# Patient Record
Sex: Male | Born: 1980 | Race: White | Hispanic: No | Marital: Single
Health system: Midwestern US, Community
[De-identification: ages and names within clinical notes are randomized; demographics above are authoritative.]

---

## 2000-06-16 ENCOUNTER — Encounter: Payer: Self-pay | Admitting: Emergency Medicine

## 2000-06-16 ENCOUNTER — Emergency Department (HOSPITAL_COMMUNITY): Admission: EM | Admit: 2000-06-16 | Discharge: 2000-06-16 | Payer: Self-pay | Admitting: Emergency Medicine

## 2004-10-12 ENCOUNTER — Emergency Department: Payer: Self-pay | Admitting: Emergency Medicine

## 2005-01-20 ENCOUNTER — Emergency Department: Payer: Self-pay | Admitting: Emergency Medicine

## 2005-07-21 ENCOUNTER — Emergency Department: Payer: Self-pay | Admitting: Emergency Medicine

## 2007-01-30 ENCOUNTER — Emergency Department: Payer: Self-pay | Admitting: Emergency Medicine

## 2007-07-13 ENCOUNTER — Emergency Department: Payer: Self-pay | Admitting: Emergency Medicine

## 2007-08-21 ENCOUNTER — Emergency Department: Payer: Self-pay | Admitting: Emergency Medicine

## 2009-04-30 ENCOUNTER — Emergency Department: Payer: Self-pay | Admitting: Emergency Medicine

## 2010-03-03 ENCOUNTER — Emergency Department: Payer: Self-pay | Admitting: Emergency Medicine

## 2011-06-26 ENCOUNTER — Emergency Department: Payer: Self-pay | Admitting: *Deleted

## 2014-05-11 ENCOUNTER — Encounter: Admit: 2014-05-11

## 2014-05-11 ENCOUNTER — Inpatient Hospital Stay
Admit: 2014-05-12 | Discharge: 2014-05-12 | Disposition: A | Discharge status: Home / Self Care | Payer: PRIVATE HEALTH INSURANCE | Source: Home / Self Care | Admitting: Emergency Medicine

## 2014-05-11 DIAGNOSIS — T1490XA Injury, unspecified, initial encounter: Secondary | ICD-10-CM

## 2014-05-11 LAB — COMPREHENSIVE METABOLIC PANEL
ALT: 24 U/L (ref 0–40)
AST: 17 U/L (ref 0–39)
Albumin: 4.4 g/dL (ref 3.5–5.2)
Alkaline Phosphatase: 78 U/L (ref 40–129)
Anion Gap: 13 mmol/L (ref 7–16)
BUN: 12 mg/dL (ref 6–20)
CO2: 26 mmol/L (ref 22–29)
Calcium: 9 mg/dL (ref 8.6–10.2)
Chloride: 102 mmol/L (ref 98–107)
Creatinine: 0.8 mg/dL (ref 0.7–1.2)
GFR African American: >60
GFR Non-African American: >60 mL/min/{1.73_m2} (ref >=60)
Glucose: 96 mg/dL (ref 74–109)
Potassium: 4 mmol/L (ref 3.5–5.0)
Sodium: 141 mmol/L (ref 132–146)
Total Bilirubin: 0.3 mg/dL (ref 0.0–1.2)
Total Protein: 6.8 g/dL (ref 6.4–8.3)

## 2014-05-11 LAB — SERUM DRUG SCREEN
Acetaminophen Level: <15 ug/mL (ref 10.0–30.0)
Ethanol Lvl: <10 mg/dL
Salicylate, Serum: <0.3 mg/dL (ref 0.0–30.0)
TCA Scrn: NEGATIVE ng/mL

## 2014-05-11 LAB — BLOOD GAS, ARTERIAL
B.E.: 2.6 mmol/L (ref <=3.0)
COHb: 6.2 % — ABNORMAL HIGH (ref 0.0–1.5)
Date Analyzed: 20160421
Date Of Collection: 20160421
HCO3: 24.9 mmol/L (ref 22.0–26.0)
HHb: 0.6 % (ref 0.0–5.0)
Lab: 9558
MetHb: 0.3 % (ref 0.0–1.5)
O2 Content: 22.6 mL/dL
O2 Sat: 99.4 % — ABNORMAL HIGH (ref 92.0–98.5)
O2Hb: 92.9 % — ABNORMAL LOW (ref 94.0–97.0)
Operator ID: 145800
PCO2: 32.5 mmHg — ABNORMAL LOW (ref 35.0–45.0)
PO2: 331 mmHg — ABNORMAL HIGH (ref 60.0–100.0)
Potassium: 3.84 mmol/L (ref 3.30–5.10)
Pt Temp: 37 C
Time Analyzed: 1810
Time Collected: 1807
pH, Blood Gas: 7.503 — ABNORMAL HIGH (ref 7.350–7.450)
tHb (est): 16.7 g/dL — ABNORMAL HIGH (ref 11.5–16.5)

## 2014-05-11 LAB — CBC
Hematocrit: 48 % (ref 37.0–54.0)
Hemoglobin: 16.2 g/dL (ref 12.5–16.5)
MCH: 31.1 pg (ref 26.0–35.0)
MCHC: 33.8 % (ref 32.0–34.5)
MCV: 92.3 fL (ref 80.0–99.9)
MPV: 8.4 fL (ref 7.0–12.0)
Platelets: 284 E9/L (ref 130–450)
RBC: 5.2 E12/L (ref 3.80–5.80)
RDW: 13.3 fL (ref 11.5–15.0)
WBC: 9.6 E9/L (ref 4.5–11.5)

## 2014-05-11 LAB — APTT: aPTT: 27.9 s (ref 24.5–35.1)

## 2014-05-11 LAB — TYPE AND SCREEN
ABO/Rh: B POS
Antibody Screen: NEGATIVE

## 2014-05-11 LAB — PROTIME-INR
INR: 1
Protime: 11.3 s (ref 9.3–12.4)

## 2014-05-11 LAB — LACTIC ACID: Lactic Acid: 2.6 mmol/L — ABNORMAL HIGH (ref 0.5–2.2)

## 2014-05-11 MED ORDER — MORPHINE SULFATE (PF) 2 MG/ML IV SOLN
2 MG/ML | INTRAVENOUS | Status: DC | PRN
Start: 2014-05-11 — End: 2014-05-11

## 2014-05-11 MED ORDER — SODIUM CHLORIDE 0.9 % IV SOLN
0.9 % | INTRAVENOUS | Status: DC
Start: 2014-05-11 — End: 2014-05-12
  Administered 2014-05-11: via INTRAVENOUS

## 2014-05-11 MED ORDER — KETOROLAC TROMETHAMINE 30 MG/ML IJ SOLN
30 MG/ML | Freq: Four times a day (QID) | INTRAMUSCULAR | Status: DC | PRN
Start: 2014-05-11 — End: 2014-05-12
  Administered 2014-05-11: 30 mg via INTRAVENOUS

## 2014-05-11 MED ORDER — ONDANSETRON HCL 4 MG/2ML IJ SOLN
4 MG/2ML | Freq: Four times a day (QID) | INTRAMUSCULAR | Status: DC | PRN
Start: 2014-05-11 — End: 2014-05-12

## 2014-05-11 MED FILL — KETOROLAC TROMETHAMINE 30 MG/ML IJ SOLN: 30 MG/ML | INTRAMUSCULAR | Qty: 1
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
## 2014-05-11 NOTE — ED Notes (Signed)
Off- going Nurse: Sheliah Hatchawnya, RN  Oncoming Nurse: Maryelizabeth RowanWyllow, RN  Reviewed results and discussed plan of care using an SBAR report at bedside.  The patient/family did want to be included.     Yvetta Coderawnya Kyrstin Campillo, RN  05/11/14 [phone]

## 2014-05-11 NOTE — ED Notes (Signed)
Patient alert and oriented x4. Speech clear. Respirations easy/unlabored. Skin warm/dry. No signs of acute distress noted. Pt stable for transport.      Jamas LavWyllow D Seydina Holliman, RN  05/11/14 [phone]
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
## 2014-05-11 NOTE — ED Notes (Signed)
Strong femoral pulses bilaterally, bilateral breath sounds bilaterally. 4mm pupils reactive bilaterally.     Yvetta Coderawnya Nussen Pullin, RN  05/11/14 [phone]

## 2014-05-11 NOTE — ED Notes (Signed)
[facility]    Yvetta Coderawnya Sueko Dimichele, RN  05/11/14 1816
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
## 2014-05-11 NOTE — ED Notes (Signed)
xrays obtained at this time.     Yvetta Coderawnya Aprill Banko, RN  05/11/14 1807

## 2014-05-11 NOTE — ED Notes (Signed)
Pt belongings with pt to CT     Yvetta Coderawnya Gayanne Prescott, RN  05/11/14 1820

## 2014-05-12 LAB — LACTIC ACID: Lactic Acid: 0.9 mmol/L (ref 0.5–2.2)

## 2014-05-12 MED ORDER — NICOTINE 21 MG/24HR TD PT24
21 MG/24HR | Freq: Once | TRANSDERMAL | Status: DC
Start: 2014-05-12 — End: 2014-05-12
  Administered 2014-05-12: 04:00:00 1 via TRANSDERMAL

## 2014-05-12 MED ORDER — ACETAMINOPHEN 650 MG RE SUPP
650 MG | RECTAL | Status: DC | PRN
Start: 2014-05-12 — End: 2014-05-11

## 2014-05-12 MED ORDER — NORMAL SALINE FLUSH 0.9 % IV SOLN
0.9 % | INTRAVENOUS | Status: DC | PRN
Start: 2014-05-12 — End: 2014-05-12

## 2014-05-12 MED ORDER — NORMAL SALINE FLUSH 0.9 % IV SOLN
0.9 % | Freq: Two times a day (BID) | INTRAVENOUS | Status: DC
Start: 2014-05-12 — End: 2014-05-12

## 2014-05-12 MED ORDER — ACETAMINOPHEN 325 MG PO TABS
325 MG | ORAL | Status: AC
Start: 2014-05-12 — End: 2014-05-11
  Administered 2014-05-12: 03:00:00 650 via ORAL

## 2014-05-12 MED ORDER — ONDANSETRON HCL 4 MG/2ML IJ SOLN
4 MG/2ML | Freq: Four times a day (QID) | INTRAMUSCULAR | Status: DC | PRN
Start: 2014-05-12 — End: 2014-05-12

## 2014-05-12 MED ORDER — MAGNESIUM HYDROXIDE 400 MG/5ML PO SUSP
400 MG/5ML | Freq: Every day | ORAL | Status: DC | PRN
Start: 2014-05-12 — End: 2014-05-12

## 2014-05-12 MED ORDER — ACETAMINOPHEN 325 MG PO TABS
325 MG | ORAL | Status: DC | PRN
Start: 2014-05-12 — End: 2014-05-12

## 2014-05-12 MED ORDER — SODIUM CHLORIDE 0.9 % IV BOLUS
0.9 % | Freq: Once | INTRAVENOUS | Status: AC
Start: 2014-05-12 — End: 2014-05-11
  Administered 2014-05-12: 01:00:00 500 mL via INTRAVENOUS

## 2014-05-12 MED FILL — NICOTINE 21 MG/24HR TD PT24: 21 MG/24HR | TRANSDERMAL | Qty: 1

## 2014-05-12 MED FILL — TYLENOL 325 MG PO TABS: 325 MG | ORAL | Qty: 2

## 2014-05-12 NOTE — Plan of Care (Signed)
Problem: Pain:  Goal: Pain level will decrease  Pain level will decrease   Outcome: Met This Shift

## 2014-05-12 NOTE — Plan of Care (Signed)
Problem: Pain:  Goal: Pain level will decrease  Pain level will decrease   Outcome: Ongoing
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
## 2014-05-12 NOTE — Other (Addendum)
Patient Acct Nbr:  [id number]  Primary AUTH/CERT:    Primary Insurance Company Name:   ANTHEM/BLUE  Primary Insurance Plan Name:  [employer] research (life [employer]UE [employer]THEM OUT OF STATE PPO HMO  Primary Insurance Group Number:  [id number]  Primary Insurance Plan Type: B  Primary Insurance Policy Number:  [id number]

## 2014-05-12 NOTE — Progress Notes (Signed)
Database completed, patient states that he does not take any medications at home.
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
## 2019-12-13 ENCOUNTER — Emergency Department: Payer: 59

## 2019-12-13 ENCOUNTER — Encounter: Payer: Self-pay | Admitting: Emergency Medicine

## 2019-12-13 ENCOUNTER — Other Ambulatory Visit: Payer: Self-pay

## 2019-12-13 ENCOUNTER — Emergency Department
Admission: EM | Admit: 2019-12-13 | Discharge: 2019-12-13 | Disposition: A | Discharge status: Home / Self Care | Payer: 59 | Attending: Emergency Medicine | Admitting: Emergency Medicine

## 2019-12-13 DIAGNOSIS — R519 Headache, unspecified: Secondary | ICD-10-CM | POA: Diagnosis not present

## 2019-12-13 DIAGNOSIS — M545 Low back pain, unspecified: Secondary | ICD-10-CM | POA: Diagnosis not present

## 2019-12-13 DIAGNOSIS — R000 Tachycardia, unspecified: Secondary | ICD-10-CM | POA: Diagnosis not present

## 2019-12-13 DIAGNOSIS — F17210 Nicotine dependence, cigarettes, uncomplicated: Secondary | ICD-10-CM | POA: Diagnosis not present

## 2019-12-13 DIAGNOSIS — R0602 Shortness of breath: Secondary | ICD-10-CM | POA: Diagnosis not present

## 2019-12-13 DIAGNOSIS — R079 Chest pain, unspecified: Secondary | ICD-10-CM

## 2019-12-13 DIAGNOSIS — R0789 Other chest pain: Secondary | ICD-10-CM | POA: Insufficient documentation

## 2019-12-13 LAB — CBC
HCT: 46.5 % (ref 39.0–52.0)
Hemoglobin: 16.2 g/dL (ref 13.0–17.0)
MCH: 32.1 pg (ref 26.0–34.0)
MCHC: 34.8 g/dL (ref 30.0–36.0)
MCV: 92.3 fL (ref 80.0–100.0)
Platelets: 368 10*3/uL (ref 150–400)
RBC: 5.04 MIL/uL (ref 4.22–5.81)
RDW: 12 % (ref 11.5–15.5)
WBC: 10.4 10*3/uL (ref 4.0–10.5)
nRBC: 0 % (ref 0.0–0.2)

## 2019-12-13 LAB — BASIC METABOLIC PANEL
Anion gap: 13 (ref 5–15)
BUN: 13 mg/dL (ref 6–20)
CO2: 24 mmol/L (ref 22–32)
Calcium: 9.4 mg/dL (ref 8.9–10.3)
Chloride: 101 mmol/L (ref 98–111)
Creatinine, Ser: 0.82 mg/dL (ref 0.61–1.24)
GFR, Estimated: >60 mL/min (ref >60)
Glucose, Bld: 130 mg/dL — ABNORMAL HIGH (ref 70–99)
Potassium: 3.8 mmol/L (ref 3.5–5.1)
Sodium: 138 mmol/L (ref 135–145)

## 2019-12-13 LAB — FIBRIN DERIVATIVES D-DIMER (ARMC ONLY): Fibrin derivatives D-dimer (ARMC): 558.71 ng/mL (FEU) — ABNORMAL HIGH (ref 0.00–499.00)

## 2019-12-13 LAB — TROPONIN I (HIGH SENSITIVITY)
Troponin I (High Sensitivity): 4 ng/L (ref <18)
Troponin I (High Sensitivity): 4 ng/L (ref <18)

## 2019-12-13 MED ORDER — IOHEXOL 350 MG/ML SOLN
75.0000 mL | Freq: Once | INTRAVENOUS | Status: AC | PRN
  Administered 2019-12-13: 75 mL via INTRAVENOUS

## 2019-12-13 NOTE — ED Notes (Signed)
Pt in CT.

## 2019-12-13 NOTE — ED Triage Notes (Signed)
C/O intermittent CP x 2 days, worse today. Describes many stressors in life currently  C/O left sided CP and SOB

## 2019-12-13 NOTE — Discharge Instructions (Signed)
Your EKG and troponins to cardiac blood test your chest x-ray and CAT scan all look okay.  I am not sure what caused the rapid heart rate or the chest discomfort.  I do want you to follow-up with cardiology.  Dr. Juliann Pares is on-call.  He is very good.  If you see a call his office tomorrow morning they should be out of set you up with an appointment within the week.  Please return here if you have worsening symptoms of chest pain shortness of breath or anything else.
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
## 2020-10-09 ENCOUNTER — Other Ambulatory Visit: Payer: Self-pay | Admitting: Family

## 2020-10-09 DIAGNOSIS — S335XXD Sprain of ligaments of lumbar spine, subsequent encounter: Secondary | ICD-10-CM

## 2020-10-13 ENCOUNTER — Ambulatory Visit
Admission: RE | Admit: 2020-10-13 | Discharge: 2020-10-13 | Disposition: A | Discharge status: Home / Self Care | Payer: Worker's Compensation | Source: Ambulatory Visit | Attending: Family | Admitting: Family

## 2020-10-13 DIAGNOSIS — S335XXD Sprain of ligaments of lumbar spine, subsequent encounter: Secondary | ICD-10-CM | POA: Insufficient documentation

## 2022-01-30 IMAGING — MR MR LUMBAR SPINE W/O CM
5 series · 31 of 48 positions shown · non-contrast
Comparison: None available.

CLINICAL DATA: Initial evaluation for low back pain for
approximately 1 month status post recent injury.

EXAM:
MRI LUMBAR SPINE WITHOUT CONTRAST
TECHNIQUE: Multiplanar, multisequence MR imaging of the lumbar spine was
performed. No intravenous contrast was administered.

[Series 5: T2 · sagittal · 4.0mm · 0.81mm/px · 6 of 17 slices shown (1 of 2)]
[im 1/17]
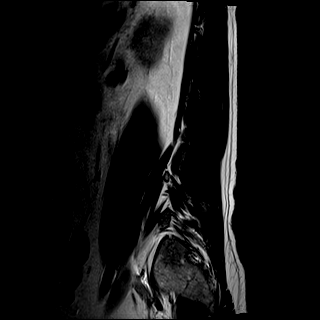
[im 4/17]
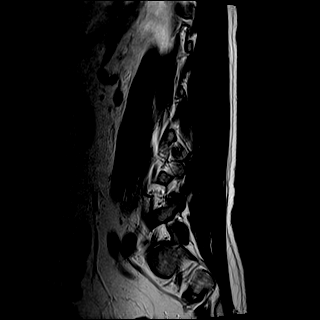
[im 7/17]
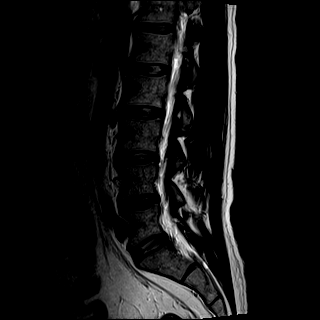
[im 10/17]
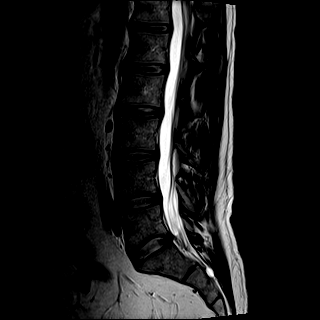
[im 13/17]
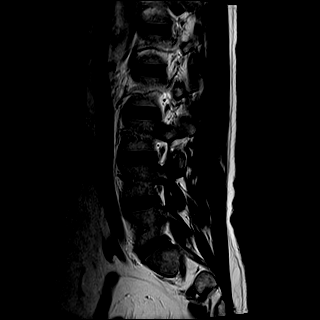
[im 17/17]
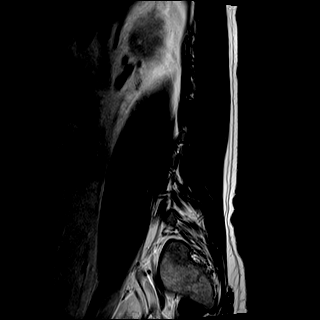

[Series 6: T1 · sagittal · 4.0mm · 0.81mm/px · 7 of 17 slices shown (1 of 2)]
[im 1/17]
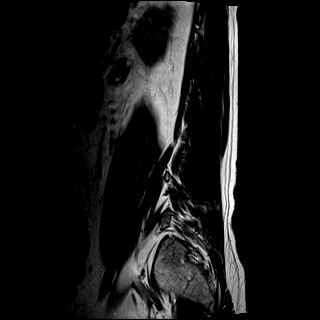
[im 3/17]
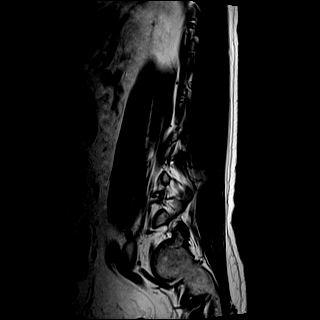
[im 6/17]
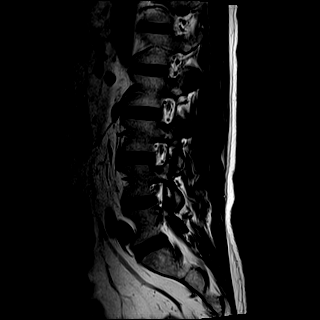
[im 9/17]
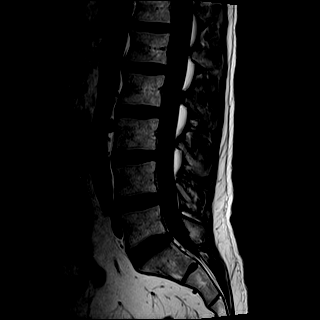
[im 11/17]
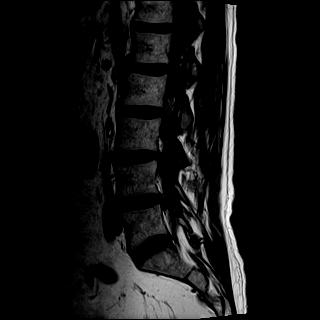
[im 14/17]
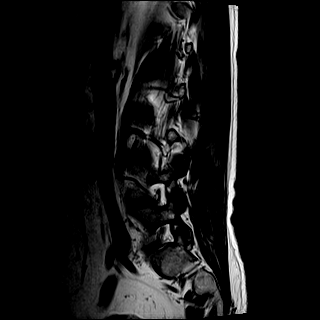
[im 17/17]
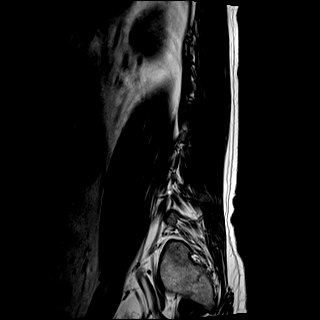

[Series 7: STIR · sagittal · 4.0mm · 0.41mm/px · 2 of 17 slices shown]
[im 1/17]
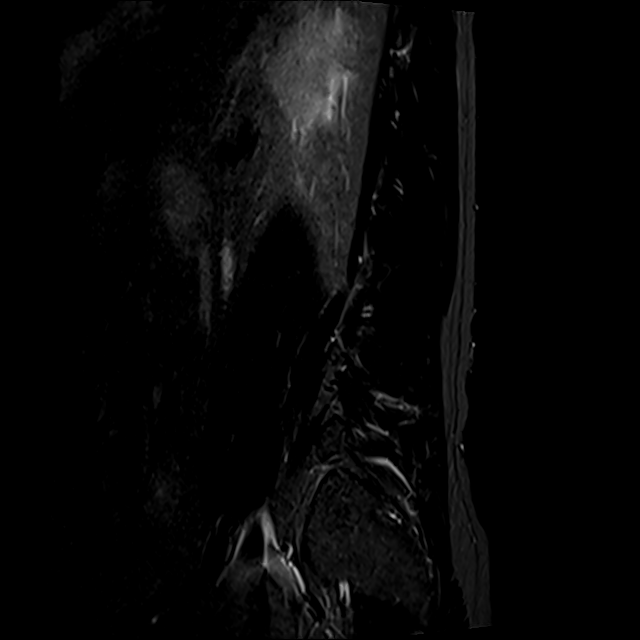
[im 3/17]
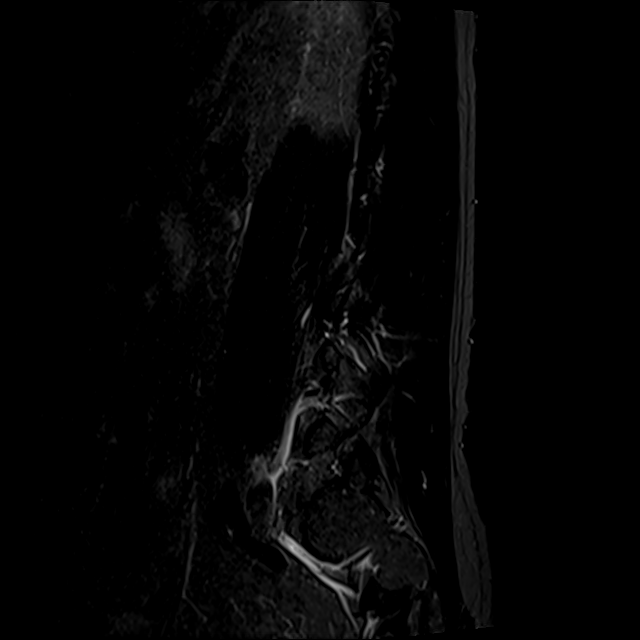

[Series 8: T2 · axial · 4.0mm · 0.78mm/px · z∈[-156,+61]mm · 8 of 37 slices shown (2 of 2)]
[im 1/37]
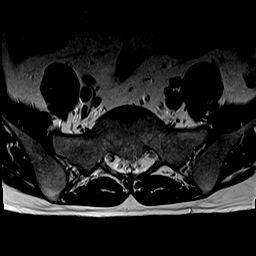
[im 6/37]
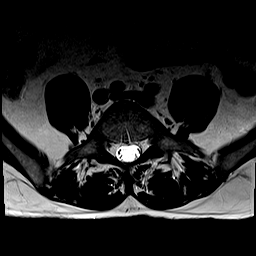
[im 12/37]
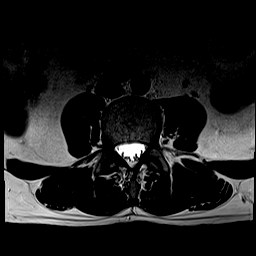
[im 17/37]
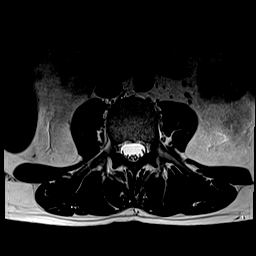
[im 20/37]
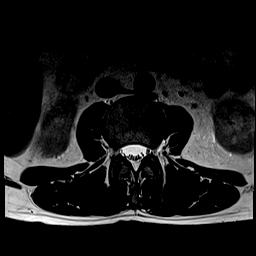
[im 25/37]
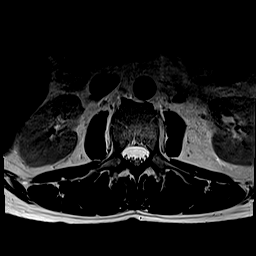
[im 31/37]
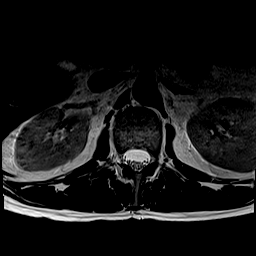
[im 37/37]
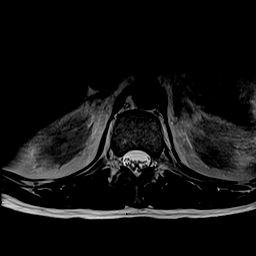

[Series 10: T1 · axial · 4.0mm · 0.39mm/px · z∈[-156,+61]mm · 8 of 37 slices shown (2 of 2)]
[im 1/37]
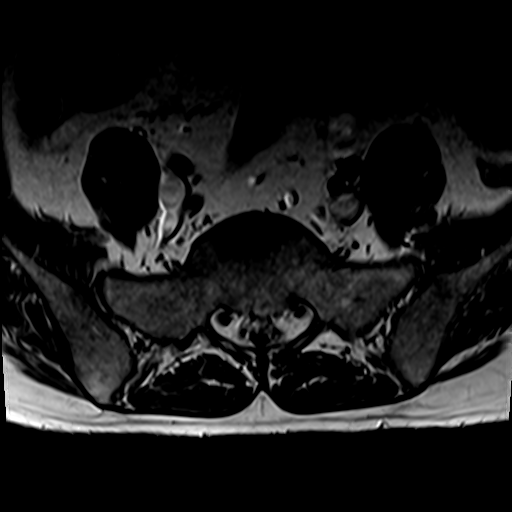
[im 6/37]
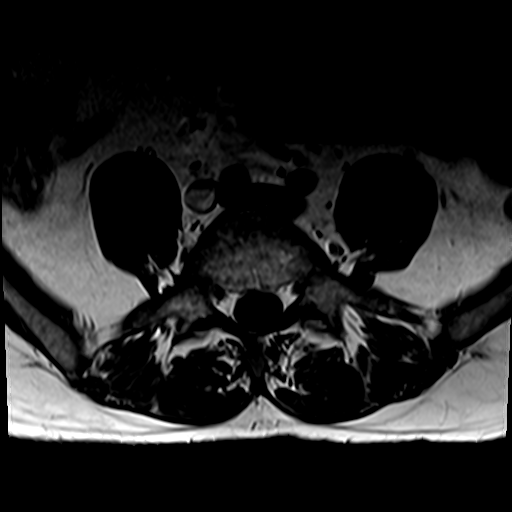
[im 12/37]
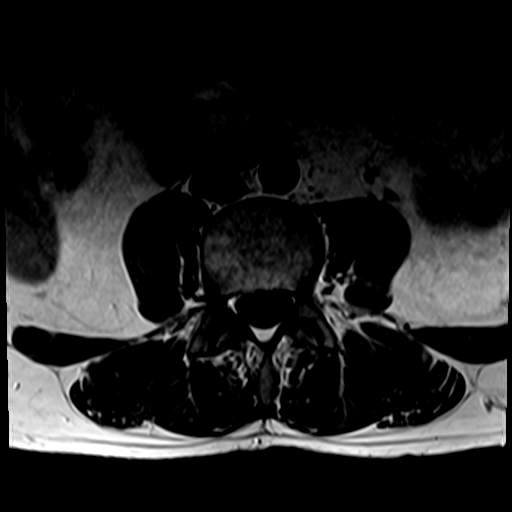
[im 17/37]
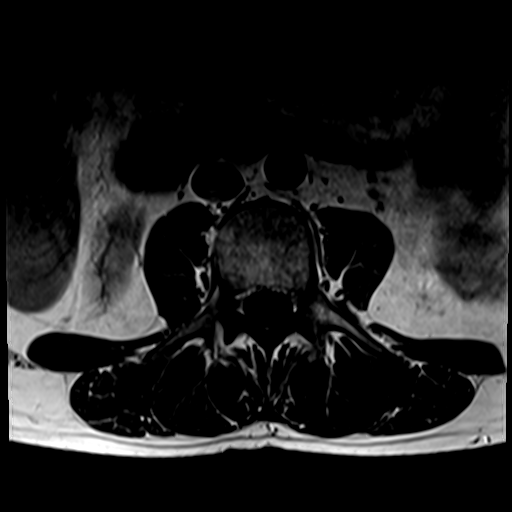
[im 20/37]
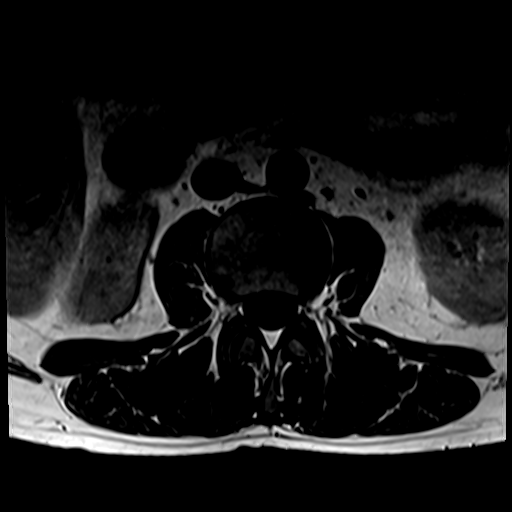
[im 25/37]
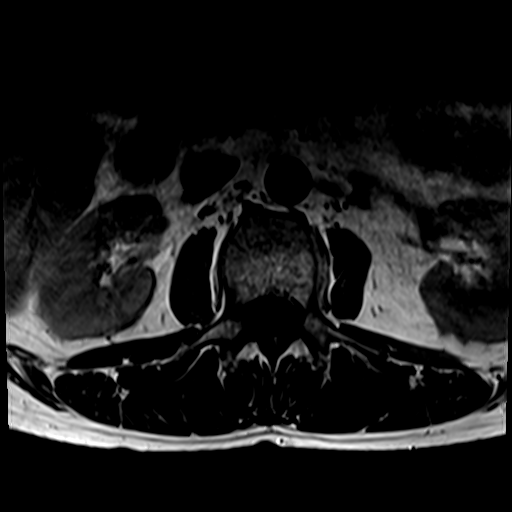
[im 31/37]
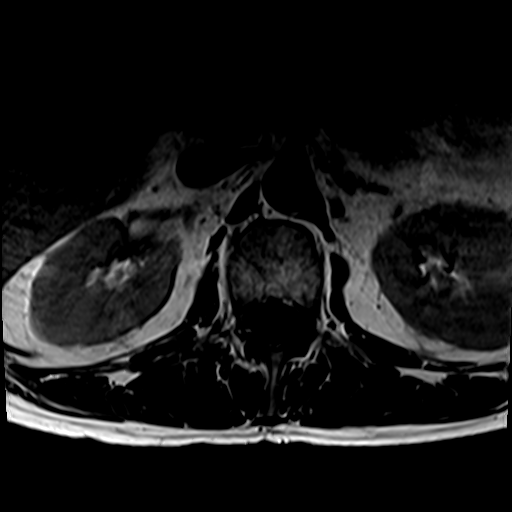
[im 37/37]
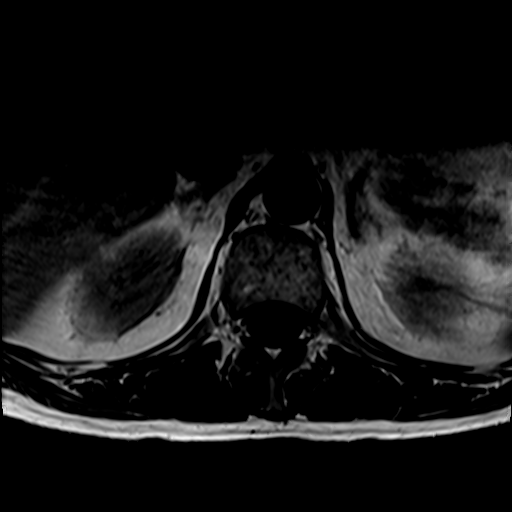

[31 of 48 positions shown; findings below may reference images not displayed]

FINDINGS: Segmentation: Standard. Lowest well-formed disc space labeled the
L5-S1 level.

Alignment: Physiologic with preservation of the normal lumbar
lordosis. No listhesis.

Vertebrae: Vertebral body height maintained without acute or chronic
fracture. Bone marrow signal intensity normal. No discrete or
worrisome osseous lesions. No abnormal marrow edema.

Conus medullaris and cauda equina: Conus extends to the T12 level.
Conus and cauda equina appear normal.

Paraspinal and other soft tissues: Unremarkable.

Disc levels:

L1-2:  Unremarkable.

L2-3:  Minimal annular disc bulge.  No canal or foraminal stenosis.

L3-4:  Unremarkable.

L4-5: Minimal annular disc bulge, slightly eccentric to the right.
Superimposed small right foraminal to extraforaminal disc protrusion
closely approximates the exiting right L4 nerve root (series 8,
image 30). Minimal facet spurring. No spinal stenosis. Foramina
remain patent.

L5-S1:  Unremarkable.
IMPRESSION: 1. Tiny right foraminal to extraforaminal disc protrusion at L4-5,
closely approximating and potentially irritating the exiting right
L4 nerve root. Finding could contribute to right lower extremity
symptoms.
2. Otherwise essentially normal MRI of the lumbar spine. No other
significant disc pathology or stenosis. No frank neural impingement.

## 2022-05-01 ENCOUNTER — Emergency Department
Admission: EM | Admit: 2022-05-01 | Discharge: 2022-05-01 | Disposition: A | Discharge status: Home / Self Care | Payer: 59 | Attending: Emergency Medicine | Admitting: Emergency Medicine

## 2022-05-01 ENCOUNTER — Other Ambulatory Visit: Payer: Self-pay

## 2022-05-01 DIAGNOSIS — L0231 Cutaneous abscess of buttock: Secondary | ICD-10-CM | POA: Insufficient documentation

## 2022-05-01 LAB — CBC WITH DIFFERENTIAL/PLATELET
Abs Immature Granulocytes: 0.03 10*3/uL (ref 0.00–0.07)
Basophils Absolute: 0.1 10*3/uL (ref 0.0–0.1)
Basophils Relative: 1 %
Eosinophils Absolute: 0.1 10*3/uL (ref 0.0–0.5)
Eosinophils Relative: 1 %
HCT: 47.2 % (ref 39.0–52.0)
Hemoglobin: 16.1 g/dL (ref 13.0–17.0)
Immature Granulocytes: 0 %
Lymphocytes Relative: 20 %
Lymphs Abs: 2.7 10*3/uL (ref 0.7–4.0)
MCH: 33.1 pg (ref 26.0–34.0)
MCHC: 34.1 g/dL (ref 30.0–36.0)
MCV: 96.9 fL (ref 80.0–100.0)
Monocytes Absolute: 1.3 10*3/uL — ABNORMAL HIGH (ref 0.1–1.0)
Monocytes Relative: 10 %
Neutro Abs: 9.4 10*3/uL — ABNORMAL HIGH (ref 1.7–7.7)
Neutrophils Relative %: 68 %
Platelets: 363 10*3/uL (ref 150–400)
RBC: 4.87 MIL/uL (ref 4.22–5.81)
RDW: 12.1 % (ref 11.5–15.5)
WBC: 13.6 10*3/uL — ABNORMAL HIGH (ref 4.0–10.5)
nRBC: 0 % (ref 0.0–0.2)

## 2022-05-01 LAB — BASIC METABOLIC PANEL
Anion gap: 8 (ref 5–15)
BUN: 6 mg/dL (ref 6–20)
CO2: 27 mmol/L (ref 22–32)
Calcium: 8.7 mg/dL — ABNORMAL LOW (ref 8.9–10.3)
Chloride: 104 mmol/L (ref 98–111)
Creatinine, Ser: 0.67 mg/dL (ref 0.61–1.24)
GFR, Estimated: >60 mL/min (ref >60)
Glucose, Bld: 103 mg/dL — ABNORMAL HIGH (ref 70–99)
Potassium: 3.9 mmol/L (ref 3.5–5.1)
Sodium: 139 mmol/L (ref 135–145)

## 2022-05-01 MED ORDER — SULFAMETHOXAZOLE-TRIMETHOPRIM 800-160 MG PO TABS: 2.0000 | ORAL_TABLET | Freq: Two times a day (BID) | ORAL | 0 refills | Status: AC

## 2022-05-01 MED ORDER — KETOROLAC TROMETHAMINE 60 MG/2ML IM SOLN
30.0000 mg | Freq: Once | INTRAMUSCULAR | Status: AC
  Administered 2022-05-01: 30 mg via INTRAMUSCULAR
  Filled 2022-05-01: qty 2

## 2022-05-01 MED ORDER — NAPROXEN 500 MG PO TABS: 500.0000 mg | ORAL_TABLET | Freq: Two times a day (BID) | ORAL | 0 refills | Status: AC

## 2022-05-01 MED ORDER — SULFAMETHOXAZOLE-TRIMETHOPRIM 800-160 MG PO TABS
2.0000 | ORAL_TABLET | Freq: Once | ORAL | Status: AC
  Administered 2022-05-01: 2 via ORAL
  Filled 2022-05-01: qty 2

## 2022-05-01 NOTE — ED Triage Notes (Signed)
Pt presents to ER with c/o abscess to his tailbone area that he states started around 36 hours ago.  Pt states abscess is about "the size of a testicle."  Pt denies any fevers at home.  States it is painful to sit down.  Pt otherwise A&O x4 and in NAD at this time.

## 2022-05-01 NOTE — ED Provider Notes (Signed)
[facility] Provider Note    Event Date/Time   First MD Initiated Contact with Patient 05/01/22 0224     (approximate)   History   Abscess   HPI  Maxwell Jacobs is a 42 y.o. male who presents to the ED from home with a chief complaint of painful abscess to left buttock which began 1.5 days ago.  States he feels like the area is the size of his thumb.  Denies associated fever/chills, abdominal pain, nausea/vomiting, dysuria.  Patient is not a diabetic.     Past Medical History  No past medical history on file.   Active Problem List  There are no problems to display for this patient.    Past Surgical History  No past surgical history on file.   Home Medications   Prior to Admission medications   Medication Sig Start Date End Date Taking? Authorizing Provider  naproxen (NAPROSYN) 500 MG tablet Take 1 tablet (500 mg total) by mouth 2 (two) times daily with a meal. 05/01/22  Yes Irean Hong, MD  sulfamethoxazole-trimethoprim (BACTRIM DS) 800-160 MG tablet Take 2 tablets by mouth 2 (two) times daily. 05/01/22  Yes Irean Hong, MD     Allergies  Patient has no known allergies.   Family History  No family history on file.   Physical Exam  Triage Vital Signs: ED Triage Vitals  Enc Vitals Group     BP 05/01/22 0050 (!) 134/108     Pulse Rate 05/01/22 0050 (!) 127     Resp 05/01/22 0050 20     Temp 05/01/22 0050 98.5 F (36.9 C)     Temp Source 05/01/22 0050 Oral     SpO2 05/01/22 0050 100 %     Weight 05/01/22 0050 175 lb (79.4 kg)     Height 05/01/22 0050 6\' 1"  (1.854 m)     Head Circumference --      Peak Flow --      Pain Score 05/01/22 0051 8     Pain Loc --      Pain Edu? --      Excl. in GC? --     Updated Vital Signs: BP (!) 134/108   Pulse (!) 127   Temp 98.5 F (36.9 C) (Oral)   Resp 20   Ht 6\' 1"  (1.854 m)   Wt 79.4 kg   SpO2 100%   BMI 23.09 kg/m    General: Awake, no distress.  CV:  Good peripheral  perfusion.  Resp:  Normal effort.  Abd:  Nontender.  No distention.  Other:  GU: No perineal erythema/warmth.  Pinky sized area of induration to left buttock without fluctuance, erythema or warmth.  Patient tolerated rectal exam which was unremarkable.   ED Results / Procedures / Treatments  Labs (all labs ordered are listed, but only abnormal results are displayed) Labs Reviewed  CBC WITH DIFFERENTIAL/PLATELET - Abnormal; Notable for the following components:      Result Value   WBC 13.6 (*)    Neutro Abs 9.4 (*)    Monocytes Absolute 1.3 (*)    All other components within normal limits  BASIC METABOLIC PANEL - Abnormal; Notable for the following components:   Glucose, Bld 103 (*)    Calcium 8.7 (*)    All other components within normal limits     EKG  None   RADIOLOGY None   Official radiology report(s): No results found.   PROCEDURES:  Critical Care  performed: No  Procedures   MEDICATIONS ORDERED IN ED: Medications  ketorolac (TORADOL) injection 30 mg (has no administration in time range)  sulfamethoxazole-trimethoprim (BACTRIM DS) 800-160 MG per tablet 2 tablet (has no administration in time range)     IMPRESSION / MDM / ASSESSMENT AND PLAN / ED COURSE  I reviewed the triage vital signs and the nursing notes.                             42 year old male presenting with painful knot to left buttock.  Differential diagnosis includes but is not limited to abscess, cellulitis, perirectal abscess, etc.  I personally reviewed patient's records and note a cardiology office visit from December 2021 for chest pain.  Patient's presentation is most consistent with acute, uncomplicated illness.  No fluctuance palpated on exam.  Will start patient on antibiotics, NSAIDs (patient declines opioids), encouraged sitz bath's and follow-up in 48 hours.  Strict return precautions given.  Patient verbalizes understanding and agrees with plan of care.      FINAL CLINICAL  IMPRESSION(S) / ED DIAGNOSES   Final diagnoses:  Abscess of buttock, left     Rx / DC Orders   ED Discharge Orders          Ordered    naproxen (NAPROSYN) 500 MG tablet  2 times daily with meals        05/01/22 0232    sulfamethoxazole-trimethoprim (BACTRIM DS) 800-160 MG tablet  2 times daily        05/01/22 0232             Note:  This document was prepared using Dragon voice recognition software and may include unintentional dictation errors.   Irean Hong, MD 05/01/22 [phone]

## 2022-05-01 NOTE — Discharge Instructions (Signed)
1.  Take and finish antibiotic as prescribed (Bactrim DS 2 tablets twice daily x 10 days). 2.  You may take Naprosyn as needed for pain. 3.  Warm soaks 3 times daily. 4.  Return to the ER for worsening symptoms, persistent vomiting, fever or other concerns.
